# Patient Record
Sex: Female | Born: 1990 | Race: White | Hispanic: No | Marital: Married
Health system: Southern US, Community
[De-identification: ages and names within clinical notes are randomized; demographics above are authoritative.]

---

## 2016-08-13 ENCOUNTER — Emergency Department

## 2016-08-13 ENCOUNTER — Encounter: Payer: Self-pay | Admitting: *Deleted

## 2016-08-13 ENCOUNTER — Emergency Department
Admission: EM | Admit: 2016-08-13 | Discharge: 2016-08-14 | Disposition: A | Discharge status: Home / Self Care | Attending: Emergency Medicine | Admitting: Emergency Medicine

## 2016-08-13 DIAGNOSIS — M542 Cervicalgia: Secondary | ICD-10-CM | POA: Diagnosis not present

## 2016-08-13 DIAGNOSIS — R079 Chest pain, unspecified: Secondary | ICD-10-CM

## 2016-08-13 DIAGNOSIS — R0789 Other chest pain: Secondary | ICD-10-CM | POA: Diagnosis not present

## 2016-08-13 LAB — BASIC METABOLIC PANEL
Anion gap: 9 (ref 5–15)
BUN: 12 mg/dL (ref 6–20)
CO2: 24 mmol/L (ref 22–32)
CREATININE: 0.58 mg/dL (ref 0.44–1.00)
Calcium: 9.3 mg/dL (ref 8.9–10.3)
Chloride: 106 mmol/L (ref 101–111)
GFR calc Af Amer: >60 mL/min (ref >60)
GLUCOSE: 109 mg/dL — AB (ref 65–99)
Potassium: 3.5 mmol/L (ref 3.5–5.1)
SODIUM: 139 mmol/L (ref 135–145)

## 2016-08-13 LAB — CBC
HCT: 39.3 % (ref 35.0–47.0)
Hemoglobin: 13.2 g/dL (ref 12.0–16.0)
MCH: 28.6 pg (ref 26.0–34.0)
MCHC: 33.7 g/dL (ref 32.0–36.0)
MCV: 85 fL (ref 80.0–100.0)
PLATELETS: 236 10*3/uL (ref 150–440)
RBC: 4.62 MIL/uL (ref 3.80–5.20)
RDW: 13.5 % (ref 11.5–14.5)
WBC: 7.1 10*3/uL (ref 3.6–11.0)

## 2016-08-13 LAB — TROPONIN I: Troponin I: <0.03 ng/mL (ref <0.03)

## 2016-08-13 NOTE — ED Triage Notes (Signed)
Pt reports chest pain and dizziness starting tonight, pt is very anxious ,

## 2016-08-13 NOTE — ED Provider Notes (Signed)
[facility] Emergency Department Provider Note   ____________________________________________   First MD Initiated Contact with Patient 08/13/16 2322     (approximate)  I have reviewed the triage vital signs and the nursing notes.   HISTORY  Chief Complaint Chest Pain    HPI Amy Alvarado is a 26 y.o. female who reports she was being seen for this increasing size mass on the back of her neck it Was shown. She reports she's had several episodes of passing out or nearly passing out in the last few weeks. She had some left sided chest pain that was sharp and tight and then neck pain although that she also has chronic neck pain she had bilateral hand and wrist numbness and felt lightheaded and weak and possibly short of breath. She has a headache behind the right eye and in the back of the right part of the head that is pressure is is worse than usual for her. Gradual onset.   History reviewed. No pertinent past medical history.  There are no active problems to display for this patient.   History reviewed. No pertinent surgical history.  Prior to Admission medications   Not on File    Allergies Augmentin [amoxicillin-pot clavulanate]; Bactrim [sulfamethoxazole-trimethoprim]; Demerol [meperidine]; and Iodine  No family history on file.  Social History Social History  Substance Use Topics  . Smoking status: Not on file  . Smokeless tobacco: Not on file  . Alcohol use Not on file    Review of Systems  Constitutional: No fever/chills Eyes: Patient says she is not having any problems with her vision but the vision in the right eye different somehow. ENT: No sore throat. Cardiovascular: Denies chest pain. Respiratory: Denies shortness of breath. Gastrointestinal: No abdominal pain.  No nausea, no vomiting.  No diarrhea.  No constipation. Genitourinary: Negative for dysuria. Musculoskeletal: Negative for back pain. Skin: Negative for  rash. Neurological: See history of present illness ____________________________________________   PHYSICAL EXAM:  VITAL SIGNS: ED Triage Vitals  Enc Vitals Group     BP 08/13/16 2212 112/68     Pulse Rate 08/13/16 2212 100     Resp 08/13/16 2212 20     Temp 08/13/16 2212 97.9 F (36.6 C)     Temp Source 08/13/16 2212 Oral     SpO2 08/13/16 2212 100 %     Weight 08/13/16 2213 173 lb (78.5 kg)     Height 08/13/16 2213 5\' 1"  (1.549 m)     Head Circumference --      Peak Flow --      Pain Score 08/13/16 2213 3     Pain Loc --      Pain Edu? --      Excl. in GC? --     Constitutional: Alert and oriented. Well appearing and in no acute distress. Eyes: Conjunctivae are normal. PERRL. EOMI. Head: Atraumatic. Nose: No congestion/rhinnorhea. Mouth/Throat: Mucous membranes are moist.  Oropharynx non-erythematous. Neck: No stridor. Patient has a soft fatty tissue feeling lump on the back of her neck at the area about C7. The size of the palm of my hand soft and nontender Cardiovascular: Normal rate, regular rhythm. Grossly normal heart sounds.  Good peripheral circulation. Respiratory: Normal respiratory effort.  No retractions. Lungs CTAB. Gastrointestinal: Soft and nontender. No distention. No abdominal bruits. No CVA tenderness. Musculoskeletal: No lower extremity tenderness nor edema.  No joint effusions. Neurologic:  Normal speech and language. Cranial nerves II through XII are intact over  the visual fields were not checked fundi look normal. Cerebellar rapid alternating movements and finger to nose are normal bilaterally motor strength is 5 over 5 throughout patient still has a little bit of numbness in the hands. The feet were numb earlier and are better now. Skin:  Skin is warm, dry and intact. No rash noted. Psychiatric: Mood and affect are normal. Speech and behavior are normal.  ____________________________________________   LABS (all labs ordered are listed, but only  abnormal results are displayed)  Labs Reviewed  BASIC METABOLIC PANEL - Abnormal; Notable for the following:       Result Value   Glucose, Bld 109 (*)    All other components within normal limits  CBC  TROPONIN I  TROPONIN I  PREGNANCY, URINE   ____________________________________________  EKG EKG read and interpreted by me shows normal sinus rhythm rate of 82 normal axis no acute ST-T wave changes computer is reading possible left atrial enlargement  ____________________________________________  RADIOLOGY IMPRESSION: No acute cardiopulmonary process seen.   Electronically Signed   By: Jeffery  Chang M.D.   On: 08/13/2016 22:53 __IMPRESSIONRoanna Raider: Normal MRI of the brain.   Electronically Signed   By: Deatra RobinsonKevin  Herman M.D.   On: 08/14/2016 03:33 IMPRESSION: Small right subarticular/foraminal disc protrusion at C5-C6 with mild right foraminal stenosis. Otherwise normal MRI of the cervical spine.   Electronically Signed   By: Deatra RobinsonKevin  Herman M.D.   On: 08/14/2016 03:51__________________________________________   PROCEDURES  Procedure(s) performed:  Procedures  Critical Care performed:  ____________________________________________   INITIAL IMPRESSION / ASSESSMENT AND PLAN / ED COURSE  Pertinent labs & imaging results that were available during my care of the patient were reviewed by me and considered in my medical decision making (see chart for details).        ____________________________________________   FINAL CLINICAL IMPRESSION(S) / ED DIAGNOSES  Final diagnoses:  Chest pain, unspecified type  Neck pain      NEW MEDICATIONS STARTED DURING THIS VISIT:  New Prescriptions   No medications on file     Note:  This document was prepared using Dragon voice recognition software and may include unintentional dictation errors.    Arnaldo NatalMalinda, Mckinzi Eriksen F, MD 08/14/16 [phone]

## 2016-08-13 NOTE — ED Notes (Signed)
Patient reports recent hx of growth on posterior neck, which is being investigated by PCP for last 4-6 months.  Patient reports multiple syncopal and near syncopal episodes in the last few weeks.  Patient c/o left chest pain described as sharpness and tightness beginning today at approx 1830. Pt reports neck pain, however reports chronic neck pain. Pt reports radiation to bilateral arms. Pt reports accompanying symptoms of SOB, dizziness, lightheadedness, weakness, near syncope, diaphoresis. Pt denies N/V and LOC.    Pt c/o headache behind right eye described as pressure. Pt denies visual changes.

## 2016-08-14 ENCOUNTER — Emergency Department

## 2016-08-14 LAB — TROPONIN I

## 2016-08-14 LAB — PREGNANCY, URINE: Preg Test, Ur: NEGATIVE

## 2016-08-14 NOTE — Discharge Instructions (Addendum)
The MRI of her brain was normal. The MRI of your neck showed possibly a pinched nerve caused by a slipped disc in the neck. This may explain some of the pain in her neck. [facility] They have a walk-in area that she can just show up at or you can call and get an appointment with the doctor of your choice. There would be best to continue the evaluation of that area on the back of your neck and your other symptoms. Please return here if you are worse.

## 2016-08-14 NOTE — ED Notes (Signed)
Report received on pt care assumed. Pt in MRI at this time.

## 2016-08-14 NOTE — ED Notes (Signed)
2 unsuccessful PIV attempts by this RN (RIGHT forearm and LEFT forearm)

## 2018-08-12 IMAGING — MR MR CERVICAL SPINE W/O CM
15 series · 48 of 48 positions shown · non-contrast
Comparison: None.

CLINICAL DATA: Bilateral hand and wrist numbness.

EXAM:
MRI CERVICAL SPINE WITHOUT CONTRAST
TECHNIQUE: Multiplanar, multisequence MR imaging of the cervical spine was
performed. No intravenous contrast was administered.

[Series 2: T1 · sagittal · 5.0mm · 0.45mm/px · 2 of 25 slices shown (1 of 3)]
[im 1/25]
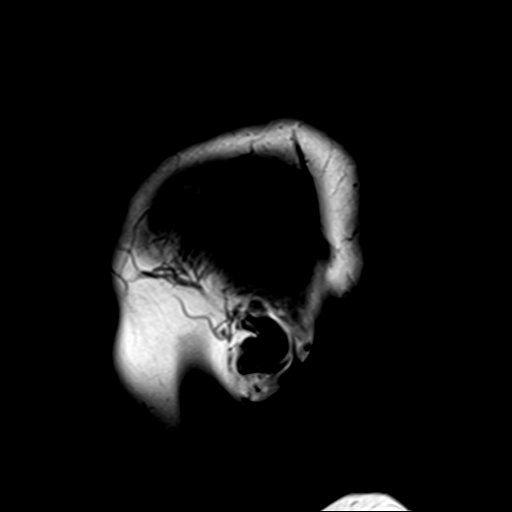
[im 25/25]
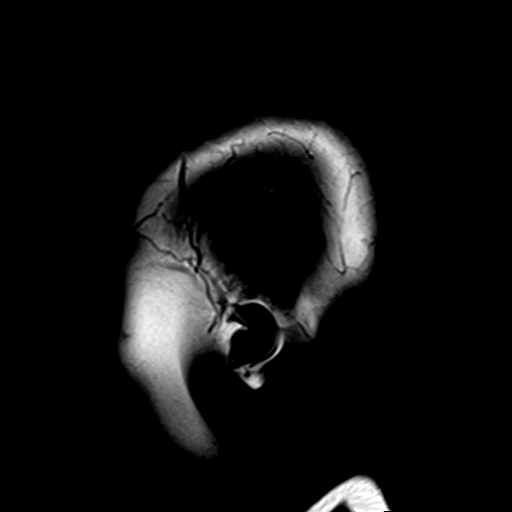

[Series 3: DWI · axial · 3.0mm · 1.80mm/px · z∈[-69,+78]mm · 9 of 150 slices shown (1 of 2)]
[im 1/150]
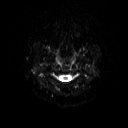
[im 19/150]
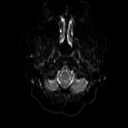
[im 38/150]
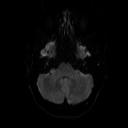
[im 56/150]
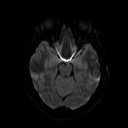
[im 75/150]
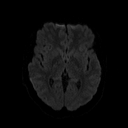
[im 94/150]
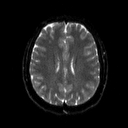
[im 112/150]
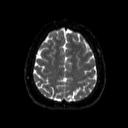
[im 131/150]
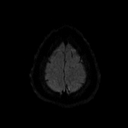
[im 150/150]
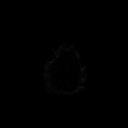

[Series 5: DWI · coronal · 3.0mm · 1.80mm/px · 8 of 135 slices shown (2 of 2)]
[im 1/135]
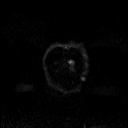
[im 20/135]
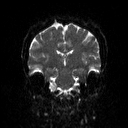
[im 39/135]
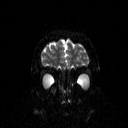
[im 58/135]
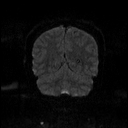
[im 77/135]
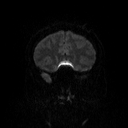
[im 96/135]
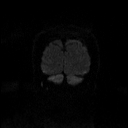
[im 115/135]
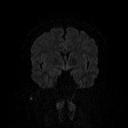
[im 135/135]
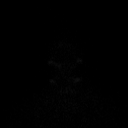

[Series 7: T2 · axial · 5.0mm · 0.60mm/px · 1 of 23 slices shown (1 of 5)]
[im 1/23]
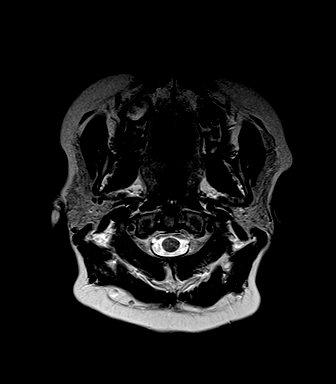

[Series 8: FLAIR · axial · 3.0mm · 0.45mm/px · z∈[-60,+81]mm · 3 of 48 slices shown]
[im 1/48]
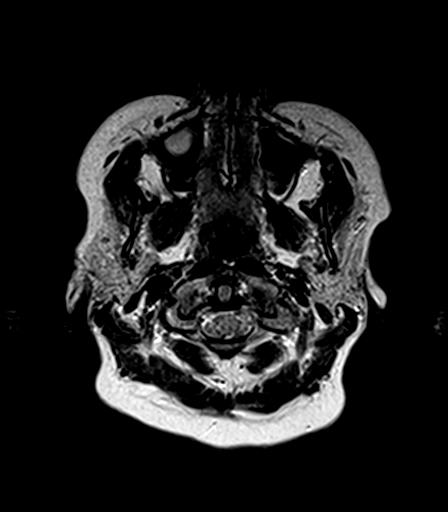
[im 24/48]
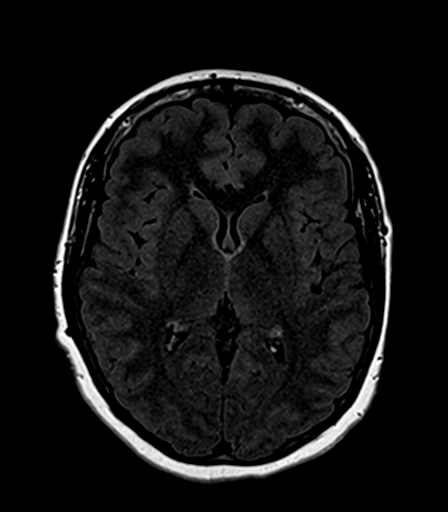
[im 48/48]
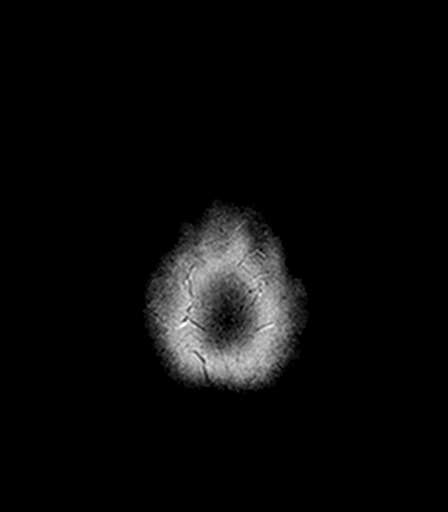

[Series 9: T2 · axial · 5.0mm · 0.45mm/px · 1 of 23 slices shown (2 of 5)]
[im 1/23]
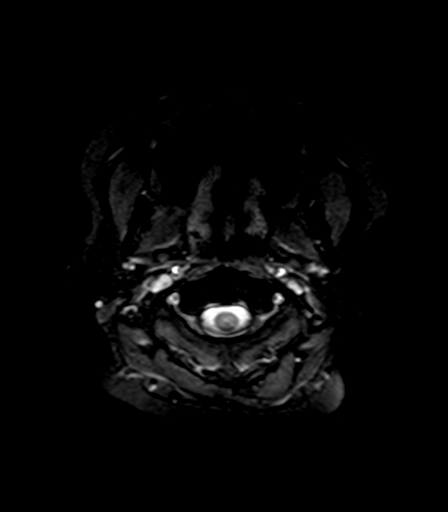

[Series 10: T1 · axial · 1.0mm · 1.00mm/px · z∈[-65,+78]mm · 9 of 144 slices shown (2 of 3)]
[im 1/144]
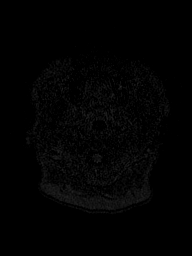
[im 18/144]
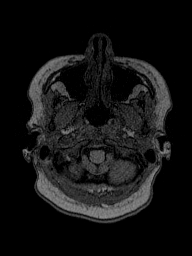
[im 36/144]
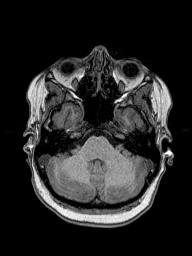
[im 54/144]
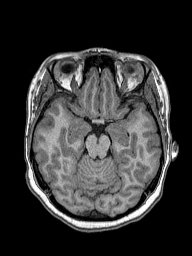
[im 72/144]
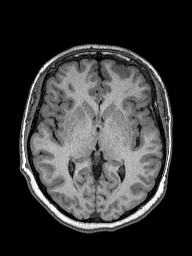
[im 90/144]
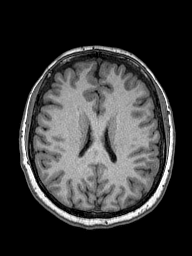
[im 108/144]
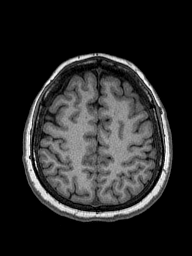
[im 126/144]
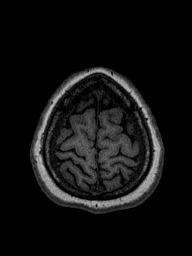
[im 144/144]
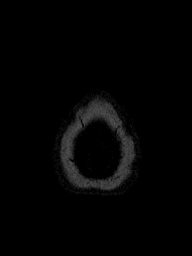

[Series 11: T2 · coronal · 5.0mm · 0.49mm/px · 2 of 27 slices shown (3 of 5)]
[im 1/27]
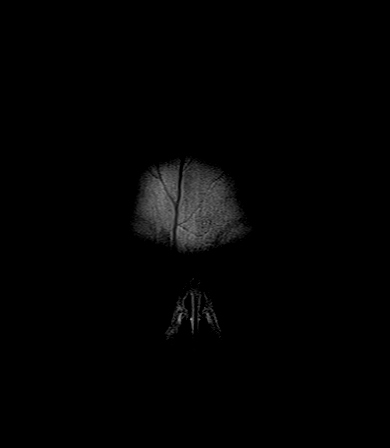
[im 27/27]
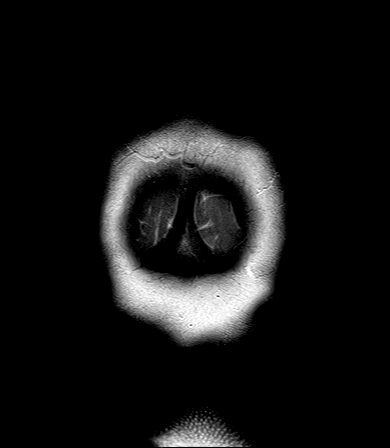

[Series 14: T1 · sagittal · 3.0mm · 0.70mm/px · 1 of 15 slices shown (3 of 3)]
[im 1/15]
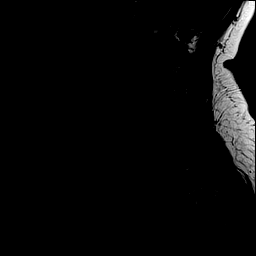

[Series 15: T2 · sagittal · 3.0mm · 0.78mm/px · 1 of 15 slices shown (4 of 5)]
[im 1/15]
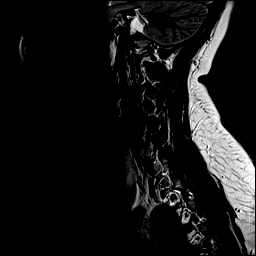

[Series 16: STIR · sagittal · 3.0mm · 0.39mm/px · 1 of 15 slices shown]
[im 1/15]
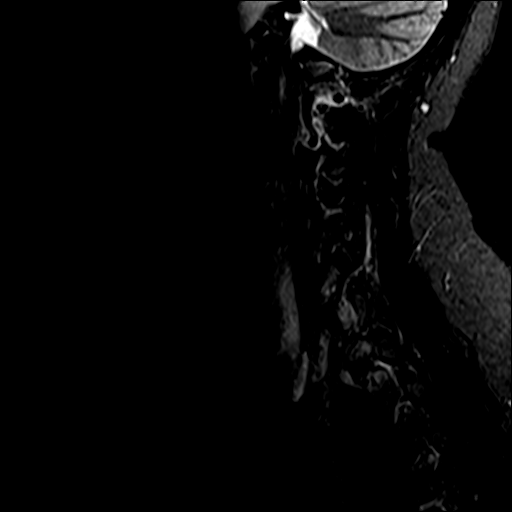

[Series 17: T2 · axial · 3.0mm · 0.70mm/px · z∈[-193,-95]mm · 2 of 27 slices shown (5 of 5)]
[im 1/27]
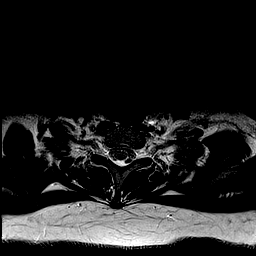
[im 27/27]
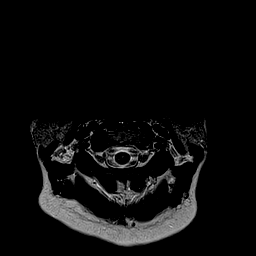

[Series 18: mpgr ax · axial · 3.0mm · 0.35mm/px · z∈[-193,-95]mm · 2 of 27 slices shown]
[im 1/27]
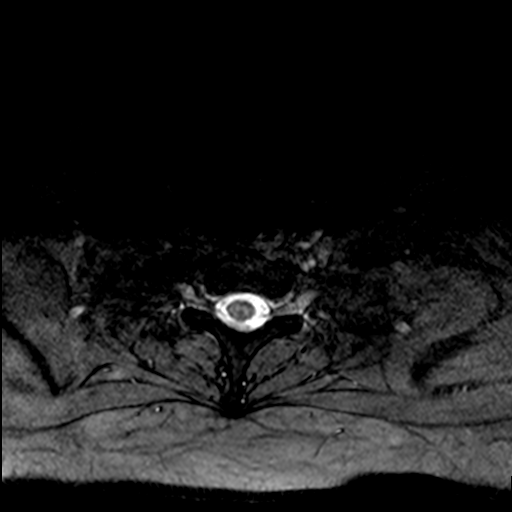
[im 27/27]
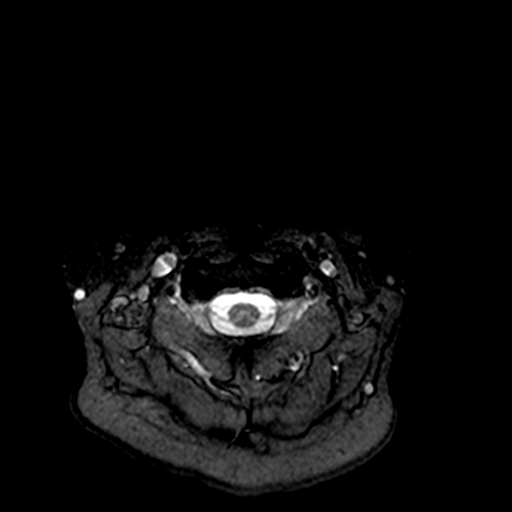

[Series 100: ax (id) · axial · 3.0mm · 1.80mm/px · z∈[-69,+78]mm · 3 of 50 slices shown]
[im 1/50]
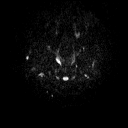
[im 25/50]
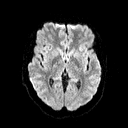
[im 50/50]
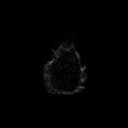

[Series 101: cor (id) · coronal · 3.0mm · 1.80mm/px · 3 of 45 slices shown]
[im 1/45]
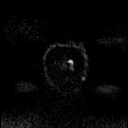
[im 23/45]
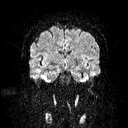
[im 45/45]
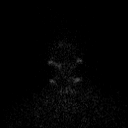

[48 of 48 positions shown; findings below may reference images not displayed]

FINDINGS: Alignment: Normal

Vertebrae: No acute compression fracture, discitis-osteomyelitis,
facet edema or other focal marrow lesion. No epidural collection.

Cord: Normal signal and caliber

Posterior Fossa, vertebral arteries, paraspinal tissues: Visualized
posterior fossa is normal. Vertebral artery flow voids are
preserved. Normal visualized paraspinal soft tissues.

Disc levels:

C1-C2: Normal.

C2-C3: Normal disc space and facets. No spinal canal or
neuroforaminal stenosis.

C3-C4: Normal disc space and facets. No spinal canal or
neuroforaminal stenosis.

C4-C5: Normal disc space and facets. No spinal canal or
neuroforaminal stenosis.

C5-C6: Right subarticular/foraminal disc protrusion with mild right
neural foraminal stenosis. No central spinal canal or left neural
foraminal stenosis.

C6-C7: Normal disc space and facets. No spinal canal or
neuroforaminal stenosis.

C7-T1: Normal disc space and facets. No spinal canal or
neuroforaminal stenosis.
IMPRESSION: Small right subarticular/foraminal disc protrusion at C5-C6 with
mild right foraminal stenosis. Otherwise normal MRI of the cervical
spine.
